# Patient Record
Sex: Male | Born: 1980 | Race: Black or African American | Hispanic: No | Marital: Single | Smoking: Never smoker
Health system: Southern US, Community
[De-identification: ages and names within clinical notes are randomized; demographics above are authoritative.]

---

## 2020-04-22 ENCOUNTER — Emergency Department (HOSPITAL_COMMUNITY)
Admission: EM | Admit: 2020-04-22 | Discharge: 2020-04-23 | Disposition: A | Discharge status: Home / Self Care | Payer: Managed Care, Other (non HMO) | Attending: Emergency Medicine | Admitting: Emergency Medicine

## 2020-04-22 ENCOUNTER — Other Ambulatory Visit: Payer: Self-pay

## 2020-04-22 ENCOUNTER — Encounter (HOSPITAL_COMMUNITY): Payer: Self-pay | Admitting: Emergency Medicine

## 2020-04-22 ENCOUNTER — Emergency Department (HOSPITAL_COMMUNITY): Payer: Managed Care, Other (non HMO)

## 2020-04-22 DIAGNOSIS — R1013 Epigastric pain: Secondary | ICD-10-CM

## 2020-04-22 DIAGNOSIS — R109 Unspecified abdominal pain: Secondary | ICD-10-CM | POA: Diagnosis present

## 2020-04-22 DIAGNOSIS — K30 Functional dyspepsia: Secondary | ICD-10-CM | POA: Insufficient documentation

## 2020-04-22 LAB — CBC
HCT: 46.2 % (ref 39.0–52.0)
Hemoglobin: 15.5 g/dL (ref 13.0–17.0)
MCH: 32 pg (ref 26.0–34.0)
MCHC: 33.5 g/dL (ref 30.0–36.0)
MCV: 95.3 fL (ref 80.0–100.0)
Platelets: 292 10*3/uL (ref 150–400)
RBC: 4.85 MIL/uL (ref 4.22–5.81)
RDW: 13.1 % (ref 11.5–15.5)
WBC: 9.3 10*3/uL (ref 4.0–10.5)
nRBC: 0 % (ref 0.0–0.2)

## 2020-04-22 LAB — URINALYSIS, ROUTINE W REFLEX MICROSCOPIC
Bilirubin Urine: NEGATIVE
Glucose, UA: NEGATIVE mg/dL
Hgb urine dipstick: NEGATIVE
Ketones, ur: NEGATIVE mg/dL
Leukocytes,Ua: NEGATIVE
Nitrite: NEGATIVE
Protein, ur: NEGATIVE mg/dL
Specific Gravity, Urine: 1.023 (ref 1.005–1.030)
pH: 6 (ref 5.0–8.0)

## 2020-04-22 LAB — COMPREHENSIVE METABOLIC PANEL
ALT: 48 U/L — ABNORMAL HIGH (ref 0–44)
AST: 41 U/L (ref 15–41)
Albumin: 4.3 g/dL (ref 3.5–5.0)
Alkaline Phosphatase: 56 U/L (ref 38–126)
Anion gap: 11 (ref 5–15)
BUN: 13 mg/dL (ref 6–20)
CO2: 27 mmol/L (ref 22–32)
Calcium: 9.8 mg/dL (ref 8.9–10.3)
Chloride: 100 mmol/L (ref 98–111)
Creatinine, Ser: 1.15 mg/dL (ref 0.61–1.24)
GFR calc Af Amer: >60 mL/min (ref >60)
GFR calc non Af Amer: >60 mL/min (ref >60)
Glucose, Bld: 98 mg/dL (ref 70–99)
Potassium: 4 mmol/L (ref 3.5–5.1)
Sodium: 138 mmol/L (ref 135–145)
Total Bilirubin: 0.8 mg/dL (ref 0.3–1.2)
Total Protein: 7.1 g/dL (ref 6.5–8.1)

## 2020-04-22 LAB — LIPASE, BLOOD: Lipase: 29 U/L (ref 11–51)

## 2020-04-22 NOTE — ED Triage Notes (Signed)
Pt c/o upper abd pain with nausea not vomiting getting worse when he eta pizza.

## 2020-04-23 NOTE — ED Notes (Signed)
Patient verbalizes understanding of discharge instructions. Opportunity for questioning and answers were provided. Armband removed by staff, pt discharged from ED ambulatory to home.  

## 2020-04-23 NOTE — ED Provider Notes (Signed)
[facility] EMERGENCY DEPARTMENT Provider Note  CSN: [id number] Arrival date & time: 04/22/20 1817  Chief Complaint(s) Abdominal Pain  HPI Zachary Gomez is a 39 y.o. male who presents to the emergency department with recurring epigastric abdominal pain.  Patient reports he initially had the pain yesterday after eating pizza.  Today he repeats that he has not had the pain once more.  He described the pain as a severe achiness.  He endorses nausea without emesis.  No fevers or chills.  No coughing congestion.  No diarrhea.  No prior episodes.  Currently he is pain-free.  Tolerating oral intake.  HPI  Past Medical History History reviewed. No pertinent past medical history. There are no problems to display for this patient.  Home Medication(s) Prior to Admission medications   Not on File                                                                                                                                    Past Surgical History History reviewed. No pertinent surgical history. Family History No family history on file.  Social History Social History   Tobacco Use  . Smoking status: Never Smoker  . Smokeless tobacco: Never Used  Substance Use Topics  . Alcohol use: Never  . Drug use: Never   Allergies Patient has no known allergies.  Review of Systems Review of Systems All other systems are reviewed and are negative for acute change except as noted in the HPI  Physical Exam Vital Signs  I have reviewed the triage vital signs BP 128/89   Pulse 66   Temp 98.6 F (37 C) (Oral)   Resp 16   Ht 5\' 11"  (1.803 m)   Wt 106.6 kg   SpO2 99%   BMI 32.78 kg/m   Physical Exam Vitals reviewed.  Constitutional:      General: He is not in acute distress.    Appearance: He is well-developed. He is not diaphoretic.  HENT:     Head: Normocephalic and atraumatic.     Jaw: No trismus.     Right Ear: External ear normal.     Left Ear: External ear  normal.     Nose: Nose normal.  Eyes:     General: No scleral icterus.    Conjunctiva/sclera: Conjunctivae normal.  Neck:     Trachea: Phonation normal.  Cardiovascular:     Rate and Rhythm: Normal rate and regular rhythm.  Pulmonary:     Effort: Pulmonary effort is normal. No respiratory distress.     Breath sounds: No stridor.  Abdominal:     General: There is no distension.     Tenderness: There is no abdominal tenderness.  Musculoskeletal:        General: Normal range of motion.     Cervical back: Normal range of motion.  Neurological:     Mental Status: He is alert and oriented  to person, place, and time.  Psychiatric:        Behavior: Behavior normal.     ED Results and Treatments Labs (all labs ordered are listed, but only abnormal results are displayed) Labs Reviewed  COMPREHENSIVE METABOLIC PANEL - Abnormal; Notable for the following components:      Result Value   ALT 48 (*)    All other components within normal limits  LIPASE, BLOOD  CBC  URINALYSIS, ROUTINE W REFLEX MICROSCOPIC                                                                                                                         EKG  EKG Interpretation  Date/Time:    Ventricular Rate:    PR Interval:    QRS Duration:   QT Interval:    QTC Calculation:   R Axis:     Text Interpretation:        Radiology US Abdomen Limited  Result Date: 04/22/2020 CLINICAL DATA:  Right upper quadrant pain EXAM: ULTRASOUND ABDOMEN LIMITED RIGHT UPPER QUADRANT COMPARISON:  None. FINDINGS: Gallbladder: No gallstones or wall thickening visualized. No sonographic Murphy sign noted by sonographer. Common bile duct: Diameter: 2.6 mm Liver: No focal lesion identified. Within normal limits in parenchymal echogenicity. Portal vein is patent on color Doppler imaging with normal direction of blood flow towards the liver. Other: None. IMPRESSION: Negative right upper quadrant abdominal ultrasound Electronically  Signed   By: Jasmine Pang M.D.   On: 04/22/2020 20:29    Pertinent labs & imaging results that were available during my care of the patient were reviewed by me and considered in my medical decision making (see chart for details).  Medications Ordered in ED Medications - No data to display                                                                                                                                  Procedures Procedures  (including critical care time)  Medical Decision Making / ED Course I have reviewed the nursing notes for this encounter and the patient's prior records (if available in EHR or on provided paperwork).   Zachary Gomez was evaluated in Emergency Department on 04/23/2020 for the symptoms described in the history of present illness. He was evaluated in the context of the global COVID-19 pandemic, which necessitated consideration that the patient might be at risk for infection with  the SARS-CoV-2 virus that causes COVID-19. Institutional protocols and algorithms that pertain to the evaluation of patients at risk for COVID-19 are in a state of rapid change based on information released by regulatory bodies including the CDC and federal and state organizations. These policies and algorithms were followed during the patient's care in the ED.  Abdomen is benign.  Labs grossly reassuring without leukocytosis or anemia.  No significant electrolyte derangements or renal sufficiency.  No evidence of biliary obstruction or pancreatitis.  Ultrasound ordered in triage was negative for acute cholecystitis or other biliary process.  Low suspicion for other serious intra-abdominal inflammatory/infectious process requiring additional imaging at this time.      Final Clinical Impression(s) / ED Diagnoses Final diagnoses:  Dyspepsia    The patient appears reasonably screened and/or stabilized for discharge and I doubt any other medical condition or other [facility] requiring  further screening, evaluation, or treatment in the ED at this time prior to discharge. Safe for discharge with strict return precautions.  Disposition: Discharge  Condition: Good  I have discussed the results, Dx and Tx plan with the patient/family who expressed understanding and agree(s) with the plan. Discharge instructions discussed at length. The patient/family was given strict return precautions who verbalized understanding of the instructions. No further questions at time of discharge.    ED Discharge Orders    None        Follow Up: Primary care provider  Schedule an appointment as soon as possible for a visit  If you do not have a primary care physician, contact [facility] at [phone] for referral    This chart was dictated using voice recognition software.  Despite best efforts to proofread,  errors can occur which can change the documentation meaning.   Nira Conn, MD 04/23/20 [phone]

## 2022-03-05 IMAGING — US US ABDOMEN LIMITED
1 series · 14 of 25 positions shown · non-contrast
Comparison: None.

CLINICAL DATA: Right upper quadrant pain

EXAM:
ULTRASOUND ABDOMEN LIMITED RIGHT UPPER QUADRANT

[Series 1: us abdomen limited · 14 of 36 slices shown]
[im 1/36]
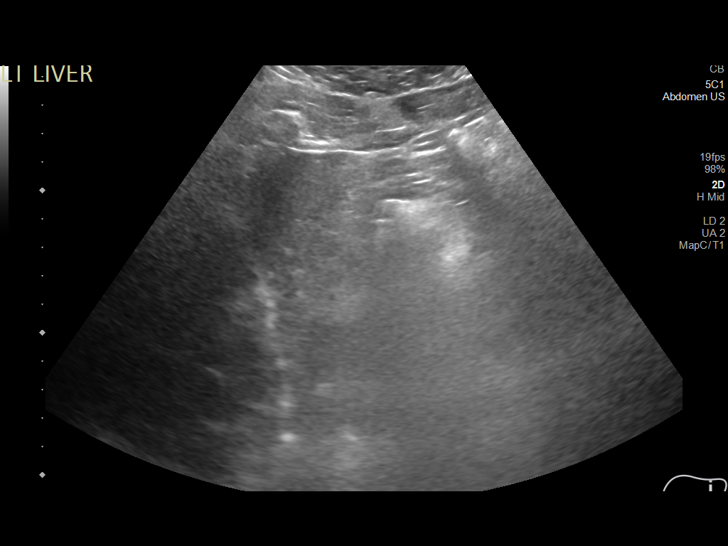
[im 3/36]
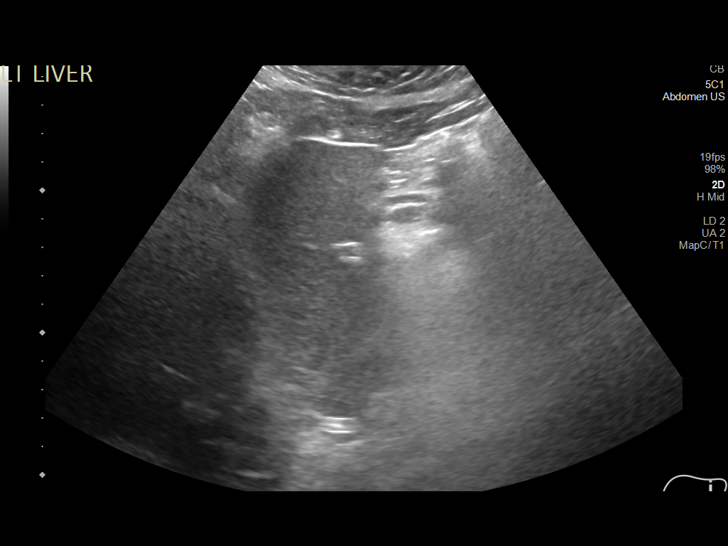
[im 6/36]
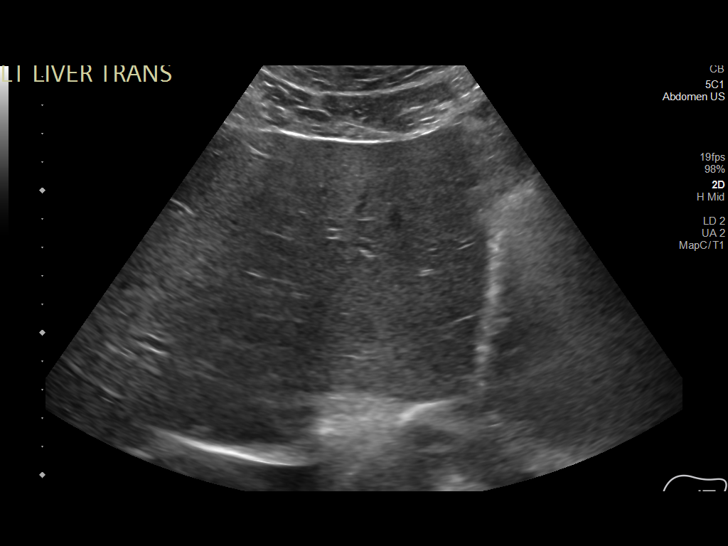
[im 9/36]
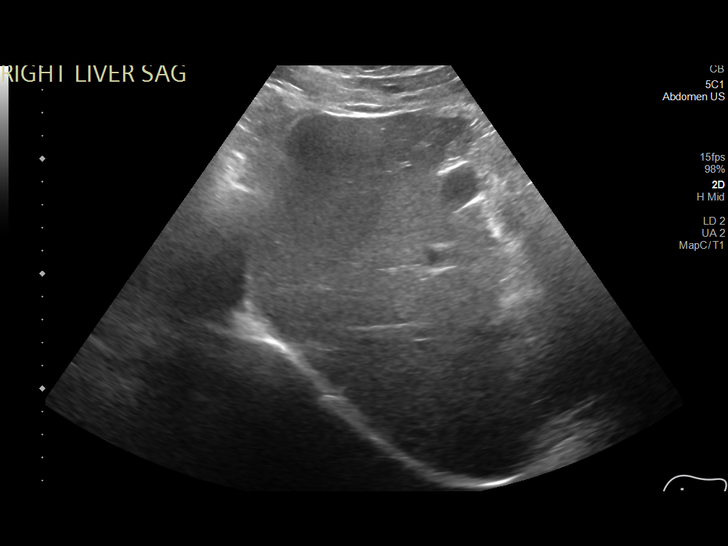
[im 12/36]
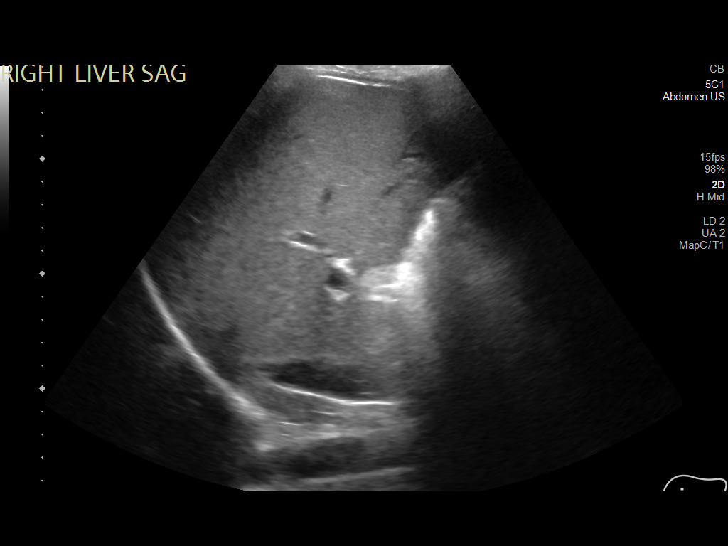
[im 14/36]
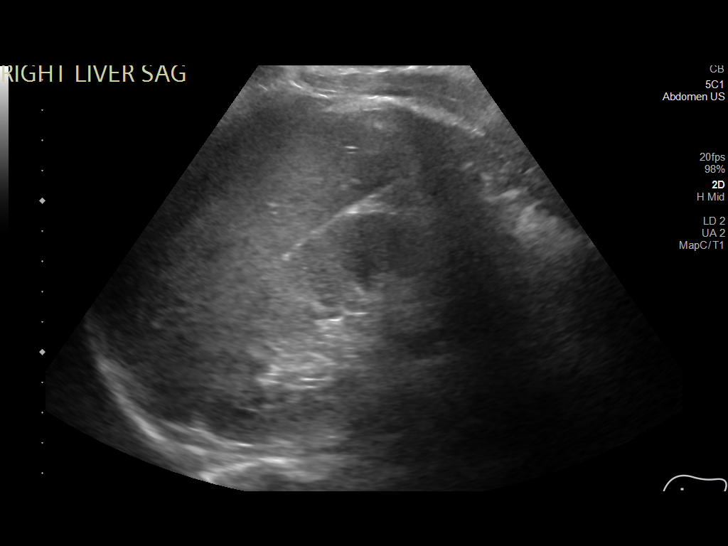
[im 17/36]
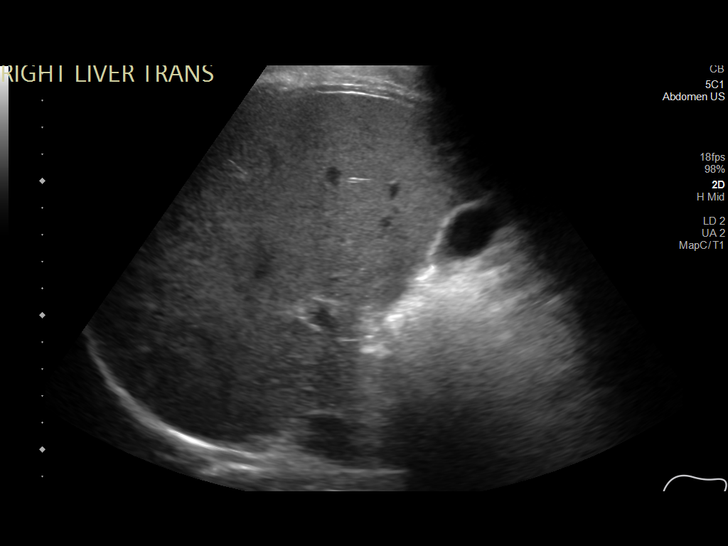
[im 19/36]
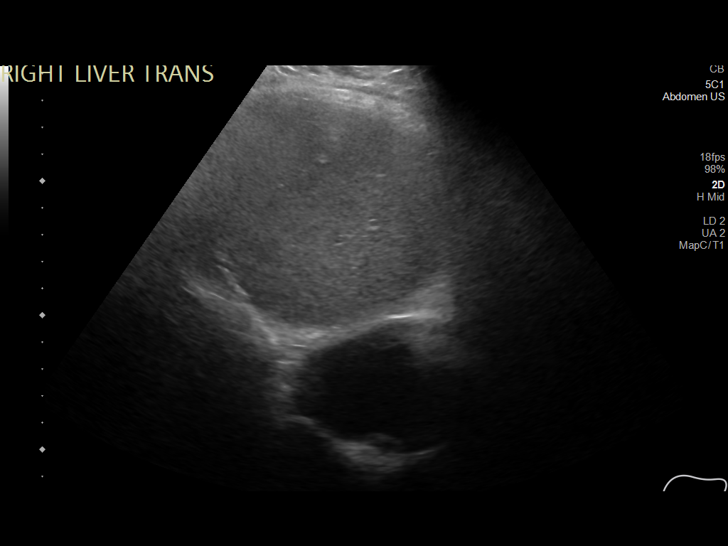
[im 22/36]
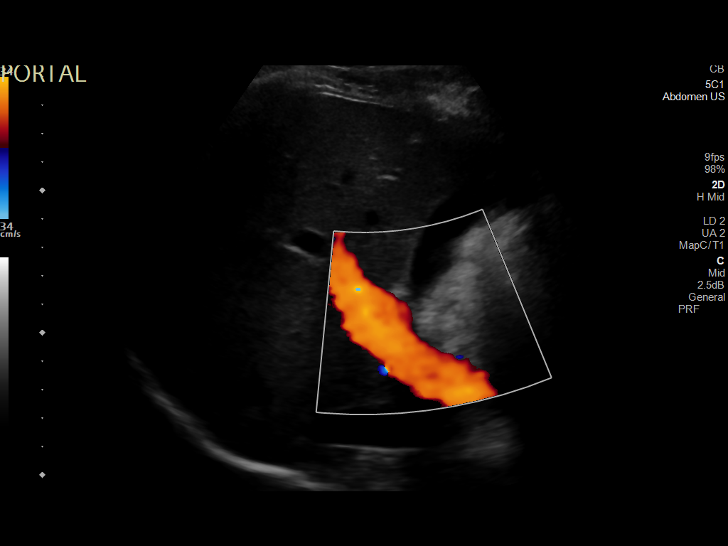
[im 24/36]
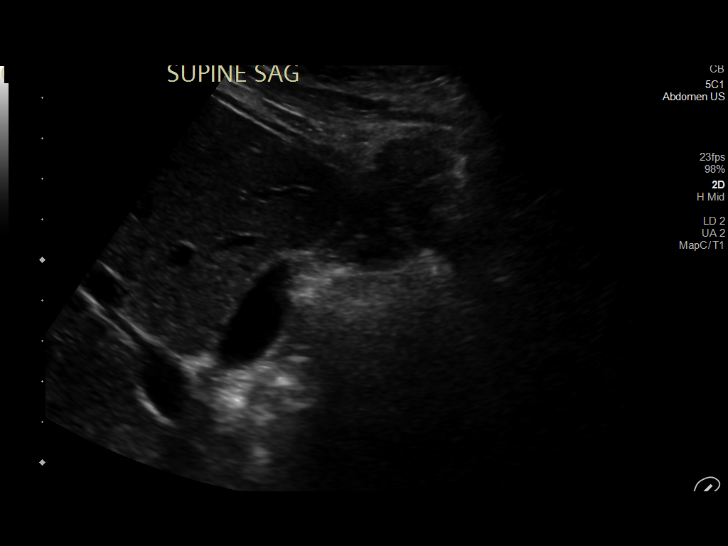
[im 27/36]
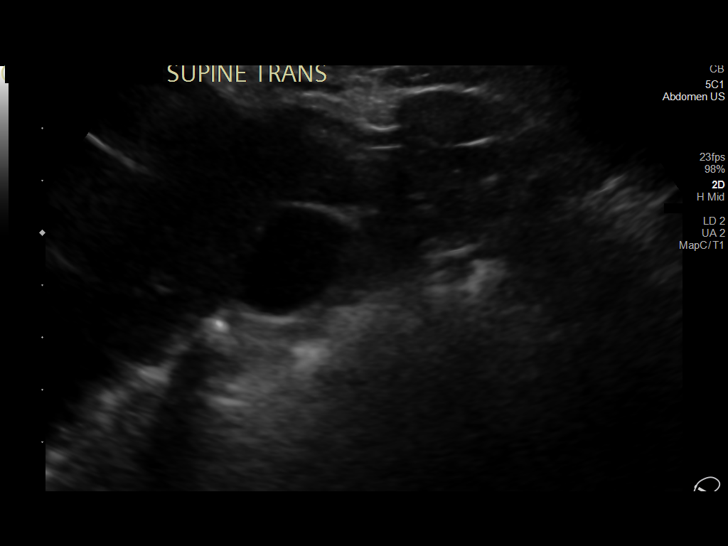
[im 30/36]
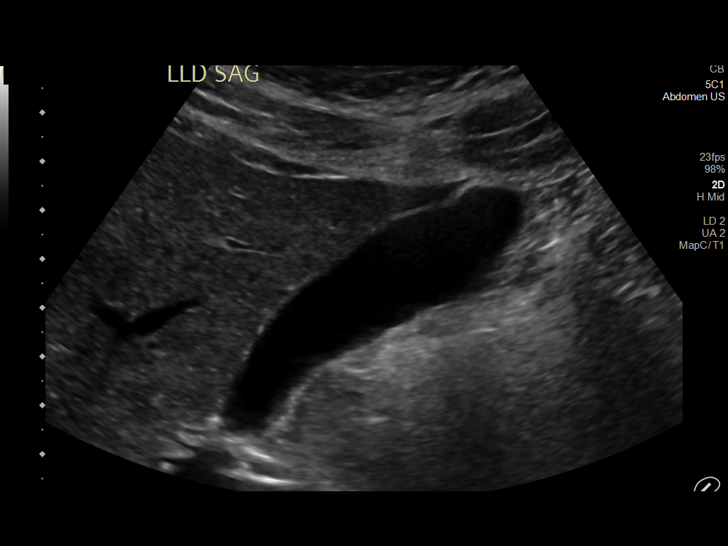
[im 33/36]
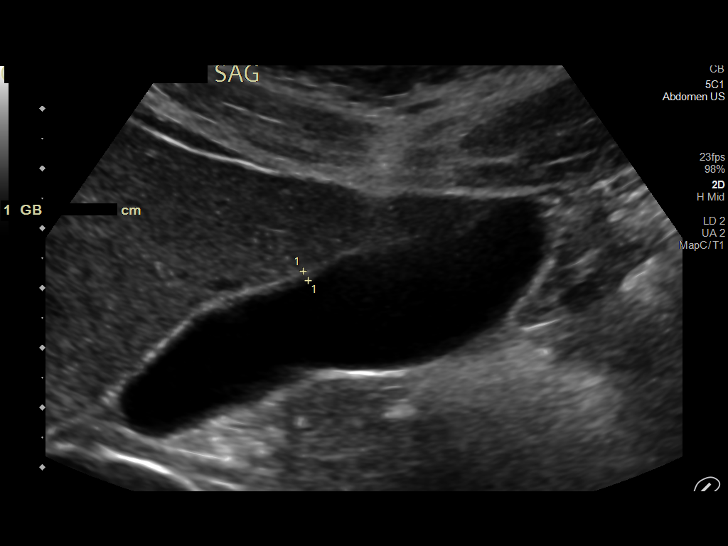
[im 36/36]
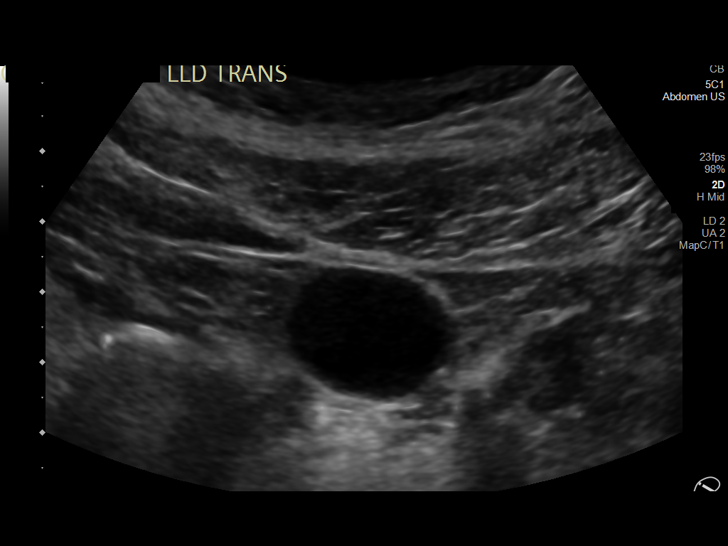

[14 of 25 positions shown; findings below may reference images not displayed]

FINDINGS: Gallbladder:

No gallstones or wall thickening visualized. No sonographic Murphy
sign noted by sonographer.

Common bile duct:

Diameter: 2.6 mm

Liver:

No focal lesion identified. Within normal limits in parenchymal
echogenicity. Portal vein is patent on color Doppler imaging with
normal direction of blood flow towards the liver.

Other: None.
IMPRESSION: Negative right upper quadrant abdominal ultrasound

## 2024-03-05 ENCOUNTER — Ambulatory Visit: Admitting: Family Medicine

## 2024-03-05 ENCOUNTER — Encounter: Payer: Self-pay | Admitting: Family Medicine

## 2024-03-05 VITALS — BP 100/60 | HR 70 | Temp 98.2°F | Ht 71.0 in | Wt 246.0 lb

## 2024-03-05 DIAGNOSIS — Z833 Family history of diabetes mellitus: Secondary | ICD-10-CM

## 2024-03-05 DIAGNOSIS — R454 Irritability and anger: Secondary | ICD-10-CM

## 2024-03-05 DIAGNOSIS — Z114 Encounter for screening for human immunodeficiency virus [HIV]: Secondary | ICD-10-CM

## 2024-03-05 DIAGNOSIS — Z23 Encounter for immunization: Secondary | ICD-10-CM | POA: Diagnosis not present

## 2024-03-05 DIAGNOSIS — E6609 Other obesity due to excess calories: Secondary | ICD-10-CM

## 2024-03-05 DIAGNOSIS — Z1159 Encounter for screening for other viral diseases: Secondary | ICD-10-CM | POA: Insufficient documentation

## 2024-03-05 DIAGNOSIS — Z6834 Body mass index (BMI) 34.0-34.9, adult: Secondary | ICD-10-CM

## 2024-03-05 DIAGNOSIS — E66811 Obesity, class 1: Secondary | ICD-10-CM | POA: Diagnosis not present

## 2024-03-05 DIAGNOSIS — R6882 Decreased libido: Secondary | ICD-10-CM | POA: Insufficient documentation

## 2024-03-05 DIAGNOSIS — Z7689 Persons encountering health services in other specified circumstances: Secondary | ICD-10-CM

## 2024-03-05 DIAGNOSIS — Z136 Encounter for screening for cardiovascular disorders: Secondary | ICD-10-CM

## 2024-03-05 MED ORDER — VENLAFAXINE HCL ER 37.5 MG PO CP24: 37.5000 mg | ORAL_CAPSULE | Freq: Every day | ORAL | 2 refills | Status: DC

## 2024-03-05 NOTE — Patient Instructions (Signed)
 Health Maintenance, Male  Adopting a healthy lifestyle and getting preventive care are important in promoting health and wellness. Ask your health care provider about:  The right schedule for you to have regular tests and exams.  Things you can do on your own to prevent diseases and keep yourself healthy.  What should I know about diet, weight, and exercise?  Eat a healthy diet    Eat a diet that includes plenty of vegetables, fruits, low-fat dairy products, and lean protein.  Do not eat a lot of foods that are high in solid fats, added sugars, or sodium.  Maintain a healthy weight  Body mass index (BMI) is a measurement that can be used to identify possible weight problems. It estimates body fat based on height and weight. Your health care provider can help determine your BMI and help you achieve or maintain a healthy weight.  Get regular exercise  Get regular exercise. This is one of the most important things you can do for your health. Most adults should:  Exercise for at least 150 minutes each week. The exercise should increase your heart rate and make you sweat (moderate-intensity exercise).  Do strengthening exercises at least twice a week. This is in addition to the moderate-intensity exercise.  Spend less time sitting. Even light physical activity can be beneficial.  Watch cholesterol and blood lipids  Have your blood tested for lipids and cholesterol at 41 years of age, then have this test every 5 years.  You may need to have your cholesterol levels checked more often if:  Your lipid or cholesterol levels are high.  You are older than 41 years of age.  You are at high risk for heart disease.  What should I know about cancer screening?  Many types of cancers can be detected early and may often be prevented. Depending on your health history and family history, you may need to have cancer screening at various ages. This may include screening for:  Colorectal cancer.  Prostate cancer.  Skin cancer.  Lung  cancer.  What should I know about heart disease, diabetes, and high blood pressure?  Blood pressure and heart disease  High blood pressure causes heart disease and increases the risk of stroke. This is more likely to develop in people who have high blood pressure readings or are overweight.  Talk with your health care provider about your target blood pressure readings.  Have your blood pressure checked:  Every 3-5 years if you are 24-52 years of age.  Every year if you are 3 years old or older.  If you are between the ages of 60 and 72 and are a current or former smoker, ask your health care provider if you should have a one-time screening for abdominal aortic aneurysm (AAA).  Diabetes  Have regular diabetes screenings. This checks your fasting blood sugar level. Have the screening done:  Once every three years after age 66 if you are at a normal weight and have a low risk for diabetes.  More often and at a younger age if you are overweight or have a high risk for diabetes.  What should I know about preventing infection?  Hepatitis B  If you have a higher risk for hepatitis B, you should be screened for this virus. Talk with your health care provider to find out if you are at risk for hepatitis B infection.  Hepatitis C  Blood testing is recommended for:  Everyone born from [birth year] through 1963.  Anyone  with known risk factors for hepatitis C.  Sexually transmitted infections (STIs)  You should be screened each year for STIs, including gonorrhea and chlamydia, if:  You are sexually active and are younger than 41 years of age.  You are older than 41 years of age and your health care provider tells you that you are at risk for this type of infection.  Your sexual activity has changed since you were last screened, and you are at increased risk for chlamydia or gonorrhea. Ask your health care provider if you are at risk.  Ask your health care provider about whether you are at high risk for HIV. Your health care provider  may recommend a prescription medicine to help prevent HIV infection. If you choose to take medicine to prevent HIV, you should first get tested for HIV. You should then be tested every 3 months for as long as you are taking the medicine.  Follow these instructions at home:  Alcohol use  Do not drink alcohol if your health care provider tells you not to drink.  If you drink alcohol:  Limit how much you have to 0-2 drinks a day.  Know how much alcohol is in your drink. In the [place] one drink equals one 12 oz bottle of beer (355 mL), one 5 oz glass of wine (148 mL), or one 1 oz glass of hard liquor (44 mL).  Lifestyle  Do not use any products that contain nicotine or tobacco. These products include cigarettes, chewing tobacco, and vaping devices, such as e-cigarettes. If you need help quitting, ask your health care provider.  Do not use street drugs.  Do not share needles.  Ask your health care provider for help if you need support or information about quitting drugs.  General instructions  Schedule regular health, dental, and eye exams.  Stay current with your vaccines.  Tell your health care provider if:  You often feel depressed.  You have ever been abused or do not feel safe at home.  Summary  Adopting a healthy lifestyle and getting preventive care are important in promoting health and wellness.  Follow your health care provider's instructions about healthy diet, exercising, and getting tested or screened for diseases.  Follow your health care provider's instructions on monitoring your cholesterol and blood pressure.  This information is not intended to replace advice given to you by your health care provider. Make sure you discuss any questions you have with your health care provider.  Document Revised: 11/29/2020 Document Reviewed: 11/29/2020  Elsevier Patient Education  2024 [employer].

## 2024-03-05 NOTE — Progress Notes (Signed)
 I,Jameka J Llittleton, CMA,acting as a neurosurgeon for [employer], NP.,have documented all relevant documentation on the behalf of Bruna Creighton, NP,as directed by  Bruna Creighton, NP while in the presence of Bruna Creighton, NP.  Subjective:   Patient ID: Zachary Gomez , male    DOB: 1980-07-25 , 43 y.o.   MRN: [id number]  Chief Complaint  Patient presents with   Establish Care    Patient presents today to establish care. Patient has seen a pcp in about 5 years.    Annual Exam   Discussed the use of AI scribe software for clinical note transcription with the patient, who gave verbal consent to proceed.  History of Present Illness     Zachary Gomez is a 43 year old male who presents to establish care and address concerns related to past cocaine use and current erectile dysfunction.  He has a history of cocaine use, primarily through snorting, but has abstained for almost six months to a year. Occasional use occurred during parties, but he has since stopped completely without formal treatment. He is concerned about potential health impacts, including liver and kidney function, and is undergoing testing for hepatitis C. He has not had a regular doctor for some time and is seeking to re-establish care.  He reports low libido, fatigue, and occasional erectile dysfunction, which he attributes to low testosterone  levels. He has tried Viagra  obtained from a friend, which provided some improvement but not to his satisfaction. He is interested in understanding if his testosterone  levels are contributing to these symptoms.  He describes mood fluctuations, including irritability and anger, particularly when visiting family. He acknowledges a history of depression and mood issues, which were exacerbated during his drug use. He has been working out, eating better, and sleeping better, which has improved his mood. He has a supportive girlfriend who understands mental health challenges, which has been beneficial for  him.  He has a family history of diabetes on his mother's side. He has six daughters, with the oldest going to college soon. He mentions a past heavy drinking habit, which he has also reduced significantly. 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 No current cravings reported. Abstinence achieved through personal willpower without formal treatment. - Order hepatitis C testing. - Order liver function tests. - Order kidney function tests.  Erectile dysfunction Erectile dysfunction with occasional low libido and fatigue. Tried Viagra  with some improvement. No chest pain or hypertension. Possible testosterone  deficiency or other underlying health issues. - Order cholesterol test. - Order diabetes screening. - Order testosterone  level test. - Consider starting Viagra  after reviewing lab results.  Unspecified mood disorder with irritability and anger Experiences irritability and anger, particularly when visiting family. Reports mood swings and anger outbursts. No formal counseling undertaken. Open to trying medication to manage symptoms. - Prescribe Effexor  37.5 mg once daily. - Consider referral to a counselor for cognitive behavioral therapy.  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 Concerned about testosterone  levels affecting physical performance. - Order testosterone  level test.   Zachary Gomez is a 43 year old male who presents to establish care and address concerns related to past cocaine use and current erectile dysfunction.  He has a history of cocaine use, primarily through snorting, but has abstained for almost six months to a year. Occasional use occurred during parties, but he has since  stopped completely without formal treatment. He is concerned about potential health impacts,  including liver and kidney function, and is undergoing testing for hepatitis C. He has not had a regular doctor for some time and is seeking to re-establish care.  He reports low libido, fatigue, and occasional erectile dysfunction, which he attributes to low testosterone  levels. He has tried Viagra  obtained from a friend, which provided some improvement but not to his satisfaction. He is interested in understanding if his testosterone  levels are contributing to these symptoms.  He describes mood fluctuations, including irritability and anger, particularly when visiting family. He acknowledges a history of depression and mood issues, which were exacerbated during his drug use. He has been working out, eating better, and sleeping better, which has improved his mood. He has a supportive girlfriend who understands mental health challenges, which has been beneficial for him.  He has a family history of diabetes on his mother's side. He has six daughters, with the oldest going to college soon. He mentions a past heavy drinking habit, which he has also reduced significantly. He is encouraged to strive for BMI less than 30 to decrease cardiac risk. Advised to aim for at least 150 minutes of exercise per week.        Family History  Problem Relation Age of Onset   Diabetes Mother      Flowsheet Row Office Visit from 03/05/2024 in [facility]  PHQ-2 Total Score 0    Social History   Substance and Sexual Activity  Alcohol Use Never   Social History   Tobacco Use  Smoking Status Never   Passive exposure: Never  Smokeless Tobacco Current   Types: Chew  .   Review of Systems  Constitutional: Negative.   HENT: Negative.    Eyes: Negative.   Respiratory: Negative.    Cardiovascular: Negative.   Gastrointestinal: Negative.   Endocrine: Negative.   Genitourinary: Negative.   Musculoskeletal: Negative.   Skin: Negative.   Neurological: Negative.    Hematological: Negative.   Psychiatric/Behavioral: Negative.       Today's Vitals   03/05/24 1459  BP: 100/60  Pulse: 70  Temp: 98.2 F (36.8 C)  TempSrc: Oral  Weight: 246 lb (111.6 kg)  Height: 5' 11 (1.803 m)  PainSc: 0-No pain   Body mass index is 34.31 kg/m.  Wt Readings from Last 3 Encounters:  03/05/24 246 lb (111.6 kg)  04/22/20 235 lb 0.2 oz (106.6 kg)    Objective:  Physical Exam HENT:     Head: Normocephalic.  Cardiovascular:     Rate and Rhythm: Normal rate and regular rhythm.  Pulmonary:     Effort: Pulmonary effort is normal.     Breath sounds: Normal breath sounds.  Musculoskeletal:        General: Normal range of motion.  Skin:    General: Skin is warm and dry.  Neurological:     General: No focal deficit present.     Mental Status: He is alert and oriented to person, place, and time.  Psychiatric:        Mood and Affect: Mood normal.        Behavior: Behavior normal.         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  Concerned about testosterone  levels affecting physical performance. - Order testosterone  level test.  Orders: -     Testosterone ,Free and Total  Class 1 obesity due to excess calories with body mass index (BMI) of 34.0 to 34.9 in adult, unspecified whether serious comorbidity present  Irritable mood Assessment & Plan: Experiences irritability and anger,  particularly when visiting family. Reports mood swings and anger outbursts. No formal counseling undertaken.   Prescribe Effexor  37.5 mg once daily. - Consider referral to a counselor for cognitive behavioral therapy.  Orders: -     Venlafaxine  HCl ER; Take 1 capsule (37.5 mg total) by mouth daily.  Dispense: 30 capsule; Refill: 2     Return in 2 months (on 05/05/2024) for follow up for med eval mood d/o, physical. Patient was given opportunity to ask questions. Patient verbalized understanding of the plan and was able to repeat key elements of the plan. All questions were answered to their satisfaction.   Bruna Creighton, NP  I, Bruna Creighton, NP, have reviewed all documentation for this visit. The documentation on 03/13/24 for the exam, diagnosis, procedures, and orders are all accurate and complete.

## 2024-03-09 LAB — LIPID PANEL
Chol/HDL Ratio: 6.4 ratio — ABNORMAL HIGH (ref 0.0–5.0)
Cholesterol, Total: 274 mg/dL — ABNORMAL HIGH (ref 100–199)
HDL: 43 mg/dL (ref >39)
LDL Chol Calc (NIH): 210 mg/dL — ABNORMAL HIGH (ref 0–99)
Triglycerides: 116 mg/dL (ref 0–149)
VLDL Cholesterol Cal: 21 mg/dL (ref 5–40)

## 2024-03-09 LAB — CMP14+EGFR
ALT: 42 IU/L (ref 0–44)
AST: 37 IU/L (ref 0–40)
Albumin: 4.6 g/dL (ref 4.1–5.1)
Alkaline Phosphatase: 57 IU/L (ref 44–121)
BUN/Creatinine Ratio: 17 (ref 9–20)
BUN: 23 mg/dL (ref 6–24)
Bilirubin Total: 0.3 mg/dL (ref 0.0–1.2)
CO2: 22 mmol/L (ref 20–29)
Calcium: 9.8 mg/dL (ref 8.7–10.2)
Chloride: 99 mmol/L (ref 96–106)
Creatinine, Ser: 1.32 mg/dL — ABNORMAL HIGH (ref 0.76–1.27)
Globulin, Total: 2.4 g/dL (ref 1.5–4.5)
Glucose: 84 mg/dL (ref 70–99)
Potassium: 4.3 mmol/L (ref 3.5–5.2)
Sodium: 137 mmol/L (ref 134–144)
Total Protein: 7 g/dL (ref 6.0–8.5)
eGFR: 70 mL/min/1.73 (ref >59)

## 2024-03-09 LAB — CBC
Hematocrit: 43 % (ref 37.5–51.0)
Hemoglobin: 13.9 g/dL (ref 13.0–17.7)
MCH: 30.5 pg (ref 26.6–33.0)
MCHC: 32.3 g/dL (ref 31.5–35.7)
MCV: 94 fL (ref 79–97)
Platelets: 325 x10E3/uL (ref 150–450)
RBC: 4.56 x10E6/uL (ref 4.14–5.80)
RDW: 13.3 % (ref 11.6–15.4)
WBC: 7.8 x10E3/uL (ref 3.4–10.8)

## 2024-03-09 LAB — TESTOSTERONE,FREE AND TOTAL
Testosterone, Free: 7.8 pg/mL (ref 6.8–21.5)
Testosterone: 414 ng/dL (ref 264–916)

## 2024-03-09 LAB — HEMOGLOBIN A1C
Est. average glucose Bld gHb Est-mCnc: 123 mg/dL
Hgb A1c MFr Bld: 5.9 % — ABNORMAL HIGH (ref 4.8–5.6)

## 2024-03-09 LAB — HIV ANTIBODY (ROUTINE TESTING W REFLEX): HIV Screen 4th Generation wRfx: NONREACTIVE

## 2024-03-09 LAB — HEPATITIS C ANTIBODY: Hep C Virus Ab: NONREACTIVE

## 2024-03-13 ENCOUNTER — Ambulatory Visit: Payer: Self-pay | Admitting: Family Medicine

## 2024-03-13 DIAGNOSIS — R454 Irritability and anger: Secondary | ICD-10-CM | POA: Insufficient documentation

## 2024-03-13 NOTE — Progress Notes (Signed)
 Your cholesterol levels are very elevated. Will send crestor  5 mg every day.Your A1c is 5.9 which makes you prediabetic, so low carbohydrates and low fat diet advised.your other labs are normal.  Thanks!

## 2024-03-13 NOTE — Assessment & Plan Note (Signed)
 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  Concerned about testosterone  levels affecting physical performance. - Order testosterone  level test.

## 2024-03-13 NOTE — Assessment & Plan Note (Signed)
 He is encouraged to strive for BMI less than 30 to decrease cardiac risk. Advised to aim for at least 150 minutes of exercise per week.

## 2024-03-13 NOTE — Assessment & Plan Note (Signed)
 Experiences irritability and anger, particularly when visiting family. Reports mood swings and anger outbursts. No formal counseling undertaken.   Prescribe Effexor  37.5 mg once daily. - Consider referral to a counselor for cognitive behavioral therapy.

## 2024-03-14 ENCOUNTER — Other Ambulatory Visit: Payer: Self-pay

## 2024-03-14 MED ORDER — ROSUVASTATIN CALCIUM 5 MG PO TABS: 5.0000 mg | ORAL_TABLET | Freq: Every day | ORAL | 2 refills | Status: DC

## 2024-05-14 ENCOUNTER — Ambulatory Visit: Admitting: Family Medicine

## 2024-05-14 VITALS — BP 124/80 | HR 69 | Temp 98.2°F | Ht 71.0 in | Wt 249.0 lb

## 2024-05-14 DIAGNOSIS — Z23 Encounter for immunization: Secondary | ICD-10-CM

## 2024-05-14 DIAGNOSIS — Z0000 Encounter for general adult medical examination without abnormal findings: Secondary | ICD-10-CM | POA: Diagnosis not present

## 2024-05-14 DIAGNOSIS — Z6834 Body mass index (BMI) 34.0-34.9, adult: Secondary | ICD-10-CM

## 2024-05-14 DIAGNOSIS — E7800 Pure hypercholesterolemia, unspecified: Secondary | ICD-10-CM

## 2024-05-14 DIAGNOSIS — R454 Irritability and anger: Secondary | ICD-10-CM | POA: Diagnosis not present

## 2024-05-14 DIAGNOSIS — E66811 Obesity, class 1: Secondary | ICD-10-CM

## 2024-05-14 DIAGNOSIS — R7303 Prediabetes: Secondary | ICD-10-CM | POA: Diagnosis not present

## 2024-05-14 DIAGNOSIS — F411 Generalized anxiety disorder: Secondary | ICD-10-CM

## 2024-05-14 DIAGNOSIS — N528 Other male erectile dysfunction: Secondary | ICD-10-CM

## 2024-05-14 DIAGNOSIS — E6609 Other obesity due to excess calories: Secondary | ICD-10-CM

## 2024-05-14 MED ORDER — VENLAFAXINE HCL ER 75 MG PO CP24: 75.0000 mg | ORAL_CAPSULE | Freq: Every day | ORAL | 2 refills | Status: AC

## 2024-05-14 MED ORDER — SILDENAFIL CITRATE 50 MG PO TABS: 50.0000 mg | ORAL_TABLET | ORAL | 1 refills | Status: AC | PRN

## 2024-05-14 NOTE — Progress Notes (Signed)
 I,Jameka J Llittleton, CMA,acting as a neurosurgeon for [employer], NP.,have documented all relevant documentation on the behalf of Bruna Creighton, NP,as directed by  Bruna Creighton, NP while in the presence of Bruna Creighton, NP.  Subjective:   Patient ID: Zachary Gomez , male    DOB: July 28, 1980 , 43 y.o.   MRN: [id number]  Chief Complaint  Patient presents with   Annual Exam    Patient presents today for a physical.     HPI Discussed the use of AI scribe software for clinical note transcription with the patient, who gave verbal consent to proceed.  History of Present Illness    Zachary Gomez is a 43 year old male who presents for an annual physical exam.  He has a history of elevated cholesterol and mood concerns. He is currently taking 37.5 mg of medication for his mood. He describes having 'good days and bad days' but feels he is handling things better and is less worried than before.  Regarding his cholesterol, he was started on medication after previous blood work showed elevated levels. He has made dietary changes, including eating shredded chicken breast during the week and opting for baked or grilled salmon on weekends. He exercises regularly. His last blood work showed a total cholesterol of 274 mg/dL and LDL cholesterol of 210 mg/dL.  He is prediabetic with an A1c of 5.9%. He maintains a healthy diet and exercise to prevent progression to diabetes.  He denies smoking but uses nicotine patches occasionally. He reports no pain or discomfort during physical activities.  He discusses his sleep patterns, stating he sleeps well on weekends but has variable sleep during the week due to work. He sometimes experiences anxiety and uses Twizzlers as a coping mechanism. He occasionally has dreams related to past relationships but does not describe them as traumatic.  He mentions using over-the-counter Viagra occasionally and has been working out, which he feels has improved his strength.       History reviewed. No pertinent past medical history.   Family History  Problem Relation Age of Onset   Diabetes Mother      Current Outpatient Medications:    sildenafil (VIAGRA) 50 MG tablet, Take 1 tablet (50 mg total) by mouth as needed for erectile dysfunction., Disp: 20 tablet, Rfl: 1   venlafaxine  XR (EFFEXOR  XR) 75 MG 24 hr capsule, Take 1 capsule (75 mg total) by mouth daily., Disp: 90 capsule, Rfl: 2   rosuvastatin  (CRESTOR ) 5 MG tablet, TAKE 1 TABLET(5 MG) BY MOUTH DAILY, Disp: 90 tablet, Rfl: 2   No Known Allergies    Flowsheet Row Office Visit from 03/05/2024 in [facility]  PHQ-2 Total Score 0   Social History   Substance and Sexual Activity  Alcohol Use Never   Social History   Tobacco Use  Smoking Status Never   Passive exposure: Never  Smokeless Tobacco Current   Types: Chew   .   Review of Systems  Constitutional: Negative.   HENT: Negative.    Eyes: Negative.   Respiratory: Negative.    Cardiovascular: Negative.   Gastrointestinal: Negative.   Endocrine: Negative.   Genitourinary: Negative.   Musculoskeletal: Negative.   Skin: Negative.   Neurological: Negative.   Hematological: Negative.   Psychiatric/Behavioral: Negative.       Today's Vitals   05/14/24 1504  BP: 124/80  Pulse: 69  Temp: 98.2 F (36.8 C)  TempSrc: Oral  Weight: 249 lb (112.9 kg)  Height: 5'  11 (1.803 m)  PainSc: 0-No pain   Body mass index is 34.73 kg/m.  Wt Readings from Last 3 Encounters:  05/14/24 249 lb (112.9 kg)  03/05/24 246 lb (111.6 kg)  04/22/20 235 lb 0.2 oz (106.6 kg)    Objective:  Physical Exam Constitutional:      Appearance: Normal appearance.  HENT:     Head: Normocephalic.     Nose: Nose normal.  Cardiovascular:     Rate and Rhythm: Normal rate and regular rhythm.     Pulses: Normal pulses.     Heart sounds: Normal heart sounds.  Pulmonary:     Effort: Pulmonary effort is normal.     Breath  sounds: Normal breath sounds.  Abdominal:     General: Bowel sounds are normal.  Musculoskeletal:        General: Normal range of motion.  Skin:    General: Skin is warm and dry.  Neurological:     General: No focal deficit present.     Mental Status: He is alert and oriented to person, place, and time. Mental status is at baseline.  Psychiatric:        Mood and Affect: Mood normal.         Assessment And Plan:    Encounter for general adult medical examination w/o abnormal findings  Need for influenza vaccination -     Flu vaccine trivalent PF, 6mos and older(Flulaval,Afluria,Fluarix,Fluzone)  Irritable mood -     Venlafaxine  HCl ER; Take 1 capsule (75 mg total) by mouth daily.  Dispense: 90 capsule; Refill: 2  Hypercholesteremia Assessment & Plan: Continue statin therapy   Prediabetes Assessment & Plan: Low carbs diet advised   Other male erectile dysfunction -     Sildenafil Citrate; Take 1 tablet (50 mg total) by mouth as needed for erectile dysfunction.  Dispense: 20 tablet; Refill: 1  Generalized anxiety disorder -     Venlafaxine  HCl ER; Take 1 capsule (75 mg total) by mouth daily.  Dispense: 90 capsule; Refill: 2 -     Amb ref to [facility]  Class 1 obesity due to excess calories with serious comorbidity and body mass index (BMI) of 34.0 to 34.9 in adult Assessment & Plan: He is encouraged to strive for BMI less than 30 to decrease cardiac risk. Advised to aim for at least 150 minutes of exercise per week.       Return in 2 months (on 07/14/2024) for 1 year physical, 2 months med re-evaluation. Patient was given opportunity to ask questions. Patient verbalized understanding of the plan and was able to repeat key elements of the plan. All questions were answered to their satisfaction.   I, Bruna Creighton, NP, have reviewed all documentation for this visit. The documentation on 05/24/2024 for the exam, diagnosis, procedures, and orders are all  accurate and complete.

## 2024-05-14 NOTE — Patient Instructions (Signed)
 Health Maintenance, Male  Adopting a healthy lifestyle and getting preventive care are important in promoting health and wellness. Ask your health care provider about:  The right schedule for you to have regular tests and exams.  Things you can do on your own to prevent diseases and keep yourself healthy.  What should I know about diet, weight, and exercise?  Eat a healthy diet    Eat a diet that includes plenty of vegetables, fruits, low-fat dairy products, and lean protein.  Do not eat a lot of foods that are high in solid fats, added sugars, or sodium.  Maintain a healthy weight  Body mass index (BMI) is a measurement that can be used to identify possible weight problems. It estimates body fat based on height and weight. Your health care provider can help determine your BMI and help you achieve or maintain a healthy weight.  Get regular exercise  Get regular exercise. This is one of the most important things you can do for your health. Most adults should:  Exercise for at least 150 minutes each week. The exercise should increase your heart rate and make you sweat (moderate-intensity exercise).  Do strengthening exercises at least twice a week. This is in addition to the moderate-intensity exercise.  Spend less time sitting. Even light physical activity can be beneficial.  Watch cholesterol and blood lipids  Have your blood tested for lipids and cholesterol at 41 years of age, then have this test every 5 years.  You may need to have your cholesterol levels checked more often if:  Your lipid or cholesterol levels are high.  You are older than 41 years of age.  You are at high risk for heart disease.  What should I know about cancer screening?  Many types of cancers can be detected early and may often be prevented. Depending on your health history and family history, you may need to have cancer screening at various ages. This may include screening for:  Colorectal cancer.  Prostate cancer.  Skin cancer.  Lung  cancer.  What should I know about heart disease, diabetes, and high blood pressure?  Blood pressure and heart disease  High blood pressure causes heart disease and increases the risk of stroke. This is more likely to develop in people who have high blood pressure readings or are overweight.  Talk with your health care provider about your target blood pressure readings.  Have your blood pressure checked:  Every 3-5 years if you are 24-52 years of age.  Every year if you are 3 years old or older.  If you are between the ages of 60 and 72 and are a current or former smoker, ask your health care provider if you should have a one-time screening for abdominal aortic aneurysm (AAA).  Diabetes  Have regular diabetes screenings. This checks your fasting blood sugar level. Have the screening done:  Once every three years after age 66 if you are at a normal weight and have a low risk for diabetes.  More often and at a younger age if you are overweight or have a high risk for diabetes.  What should I know about preventing infection?  Hepatitis B  If you have a higher risk for hepatitis B, you should be screened for this virus. Talk with your health care provider to find out if you are at risk for hepatitis B infection.  Hepatitis C  Blood testing is recommended for:  Everyone born from [birth year] through 1963.  Anyone  with known risk factors for hepatitis C.  Sexually transmitted infections (STIs)  You should be screened each year for STIs, including gonorrhea and chlamydia, if:  You are sexually active and are younger than 41 years of age.  You are older than 41 years of age and your health care provider tells you that you are at risk for this type of infection.  Your sexual activity has changed since you were last screened, and you are at increased risk for chlamydia or gonorrhea. Ask your health care provider if you are at risk.  Ask your health care provider about whether you are at high risk for HIV. Your health care provider  may recommend a prescription medicine to help prevent HIV infection. If you choose to take medicine to prevent HIV, you should first get tested for HIV. You should then be tested every 3 months for as long as you are taking the medicine.  Follow these instructions at home:  Alcohol use  Do not drink alcohol if your health care provider tells you not to drink.  If you drink alcohol:  Limit how much you have to 0-2 drinks a day.  Know how much alcohol is in your drink. In the [place] one drink equals one 12 oz bottle of beer (355 mL), one 5 oz glass of wine (148 mL), or one 1 oz glass of hard liquor (44 mL).  Lifestyle  Do not use any products that contain nicotine or tobacco. These products include cigarettes, chewing tobacco, and vaping devices, such as e-cigarettes. If you need help quitting, ask your health care provider.  Do not use street drugs.  Do not share needles.  Ask your health care provider for help if you need support or information about quitting drugs.  General instructions  Schedule regular health, dental, and eye exams.  Stay current with your vaccines.  Tell your health care provider if:  You often feel depressed.  You have ever been abused or do not feel safe at home.  Summary  Adopting a healthy lifestyle and getting preventive care are important in promoting health and wellness.  Follow your health care provider's instructions about healthy diet, exercising, and getting tested or screened for diseases.  Follow your health care provider's instructions on monitoring your cholesterol and blood pressure.  This information is not intended to replace advice given to you by your health care provider. Make sure you discuss any questions you have with your health care provider.  Document Revised: 11/29/2020 Document Reviewed: 11/29/2020  Elsevier Patient Education  2024 [employer].

## 2024-05-15 ENCOUNTER — Other Ambulatory Visit: Payer: Self-pay | Admitting: Family Medicine

## 2024-05-24 DIAGNOSIS — R7303 Prediabetes: Secondary | ICD-10-CM | POA: Insufficient documentation

## 2024-05-24 DIAGNOSIS — N528 Other male erectile dysfunction: Secondary | ICD-10-CM | POA: Insufficient documentation

## 2024-05-24 DIAGNOSIS — E7800 Pure hypercholesterolemia, unspecified: Secondary | ICD-10-CM | POA: Insufficient documentation

## 2024-05-24 DIAGNOSIS — F411 Generalized anxiety disorder: Secondary | ICD-10-CM | POA: Insufficient documentation

## 2024-05-24 NOTE — Assessment & Plan Note (Signed)
 Low carbs diet advised.

## 2024-05-24 NOTE — Assessment & Plan Note (Signed)
 He is encouraged to strive for BMI less than 30 to decrease cardiac risk. Advised to aim for at least 150 minutes of exercise per week.

## 2024-05-24 NOTE — Assessment & Plan Note (Signed)
 Continue statin therapy

## 2024-06-12 ENCOUNTER — Institutional Professional Consult (permissible substitution): Payer: Self-pay | Admitting: Licensed Clinical Social Worker

## 2024-07-30 ENCOUNTER — Ambulatory Visit: Payer: Self-pay | Admitting: Family Medicine

## 2025-05-20 ENCOUNTER — Encounter: Payer: Self-pay | Admitting: Family Medicine
